# Patient Record
Sex: Male | Born: 1945 | Race: White | Hispanic: No | Marital: Married | Smoking: Former smoker
Health system: Southern US, Community
[De-identification: ages and names within clinical notes are randomized; demographics above are authoritative.]

## PROBLEM LIST (undated history)

## (undated) DIAGNOSIS — K644 Residual hemorrhoidal skin tags: Secondary | ICD-10-CM

## (undated) DIAGNOSIS — Z720 Tobacco use: Secondary | ICD-10-CM

## (undated) DIAGNOSIS — R011 Cardiac murmur, unspecified: Secondary | ICD-10-CM

## (undated) DIAGNOSIS — I10 Essential (primary) hypertension: Secondary | ICD-10-CM

## (undated) DIAGNOSIS — E785 Hyperlipidemia, unspecified: Secondary | ICD-10-CM

## (undated) HISTORY — DX: Cardiac murmur, unspecified: R01.1

## (undated) HISTORY — DX: Residual hemorrhoidal skin tags: K64.4

## (undated) HISTORY — PX: MOUTH SURGERY: SHX715

## (undated) HISTORY — DX: Hyperlipidemia, unspecified: E78.5

## (undated) HISTORY — DX: Tobacco use: Z72.0

## (undated) HISTORY — DX: Essential (primary) hypertension: I10

## (undated) HISTORY — PX: COLONOSCOPY: SHX174

---

## 2011-06-13 HISTORY — PX: COLONOSCOPY: SHX5424

## 2014-07-07 ENCOUNTER — Other Ambulatory Visit: Payer: Self-pay | Admitting: Internal Medicine

## 2014-07-07 DIAGNOSIS — F17200 Nicotine dependence, unspecified, uncomplicated: Secondary | ICD-10-CM

## 2014-08-14 ENCOUNTER — Ambulatory Visit: Payer: Self-pay

## 2015-07-22 ENCOUNTER — Encounter: Payer: Self-pay | Admitting: Physician Assistant

## 2015-07-29 ENCOUNTER — Ambulatory Visit (INDEPENDENT_AMBULATORY_CARE_PROVIDER_SITE_OTHER): Payer: Medicare Other | Admitting: Physician Assistant

## 2015-07-29 ENCOUNTER — Encounter: Payer: Self-pay | Admitting: Physician Assistant

## 2015-07-29 VITALS — BP 132/74 | HR 75 | Ht 72.0 in | Wt 193.0 lb

## 2015-07-29 DIAGNOSIS — I10 Essential (primary) hypertension: Secondary | ICD-10-CM

## 2015-07-29 DIAGNOSIS — E785 Hyperlipidemia, unspecified: Secondary | ICD-10-CM | POA: Insufficient documentation

## 2015-07-29 DIAGNOSIS — Z86010 Personal history of colonic polyps: Secondary | ICD-10-CM

## 2015-07-29 DIAGNOSIS — R195 Other fecal abnormalities: Secondary | ICD-10-CM | POA: Diagnosis not present

## 2015-07-29 NOTE — Patient Instructions (Signed)

## 2015-07-29 NOTE — Progress Notes (Addendum)
Patient ID: Gerald Turner, male   DOB: [birth year], 69 y.o.   MRN: [id number]   Subjective:    Patient ID: Gerald Turner, male    DOB: Feb 27, 1946, 69 y.o.   MRN: [id number]  HPI [name] is a pleasant 69 year old white male, new to GI today referred by Dr. Ballard Russell for evaluation of Hemoccult-positive stool. Patient has prior history of colon polyps and states that his last colonoscopy was done in 2012 in [place] he says he was told that he did have a couple of polyps and to follow-up in 5 years. Family history is negative for colon cancer. Patient did stool for occult blood testing as part of a routine physical and team assure has returned positive. He says he has not noticed any problems himself, specifically denies any abdominal pain,  changes in bowel habits, melena or hematochezia. He says he had been taking ibuprofen on a fairly regular basis for tooth pain earlier in the summer but stopped that as soon as she learned of the Hemoccult-positive stool. He is generally been in good health with history of hypertension and hyperlipidemia, he is a smoker.  Review of Systems Pertinent positive and negative review of systems were noted in the above HPI section.  All other review of systems was otherwise negative.  Outpatient Encounter Prescriptions as of 07/29/2015  Medication Sig  . aspirin 81 MG tablet Take 81 mg by mouth daily.  Marland Kitchen lisinopril (PRINIVIL,ZESTRIL) 10 MG tablet Take 10 mg by mouth daily.  . Multiple Vitamin (MULTIVITAMIN) tablet Take 1 tablet by mouth daily.  . simvastatin (ZOCOR) 40 MG tablet Take 40 mg by mouth daily.   No facility-administered encounter medications on file as of 07/29/2015.   Allergies  Allergen Reactions  . Penicillins Other (See Comments)   Patient Active Problem List   Diagnosis Date Noted  . HTN (hypertension) 07/29/2015  . Hyperlipidemia 07/29/2015   Social History   Social History  . Marital Status: Married    Spouse Name: Gerald Turner  . Number of  Children: Gerald Turner  . Years of Education: Gerald Turner   Occupational History  . Not on file.   Social History Main Topics  . Smoking status: Current Every Day Smoker -- 1.00 packs/day for 40 years    Types: Cigarettes  . Smokeless tobacco: Never Used  . Alcohol Use: 0.0 oz/week    0 Standard drinks or equivalent per week     Comment: 4 beers a day  . Drug Use: No  . Sexual Activity: Not on file   Other Topics Concern  . Not on file   Social History Narrative    Mr. Ratto's family history includes Breast cancer in his mother; Congestive Heart Failure in his mother; Diabetes in his father; Heart disease in his father; Hypertension in his father.      Objective:    Filed Vitals:   07/29/15 0900  BP: 132/74  Pulse: 75    Physical Exam well-developed older white male in no acute distress, pleasant blood pressure 132/74 pulse 75 height 6 foot weight 193. HEENT ;nontraumatic normocephalic EOMI PERRLA sclera anicteric, Supple ;no JVD, Cardiovascular; regular rate and rhythm with S1-S2 no murmur rub or gallop, Pulmonary; clear bilaterally, Abdomen ;soft nontender nondistended bowel sounds are active there is no palpable mass or hepatosplenomegaly, Rectal ;exam not done, Extremities; no clubbing cyanosis or edema skin warm and dry, Neuropsych; mood and affect appropriate     Assessment & Plan:   #1 69 yo male with heme + stool,  and hx of colon polyps- last colonoscopy 2012 in [place] with removal of polyps -type unclear- will  R/O occult lesion #2 HTN  #3 Smoker #4 hyperlipidemia  Plan; Pt will be scheduled   for Colonoscopy with Dr Christella Hartigan- procedure discussed in detail with pt and he is agreeable to proceed.  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    Hampton Wixom S  Elberta Lachapelle PA-C 07/29/2015   Cc: Alysia Penna, MD

## 2015-07-29 NOTE — Progress Notes (Signed)
I agree with the above note, plan. Seems like he is being 'over screened' with hemocult testing.  I will discuss colon cancer screening, polyp surveillance guidelines with him at time of his upcoming colonoscopy.

## 2015-08-19 ENCOUNTER — Telehealth: Payer: Self-pay | Admitting: *Deleted

## 2015-08-19 NOTE — Telephone Encounter (Signed)
Faxed a signed release on 07-29-2015.  Need this patient's Colonoscopy and pathology report from 06-09-2011.  I called that office today, 08-19-2015 and advised Aundra Millet in Medical Records I had not received the records yet.  She told me they faxed this information to me on 07-30-2015. I advised  I don't doubt she faxed it but asked her to refax it today and she said she could do that.  Received the colonoscopy and path report from 2012.  Stamped with Amy Esterwood PA's signature, date and MR # on each page, sent down to medical records department.

## 2015-09-14 ENCOUNTER — Encounter: Payer: Self-pay | Admitting: Gastroenterology

## 2015-10-26 ENCOUNTER — Ambulatory Visit (AMBULATORY_SURGERY_CENTER): Payer: Self-pay | Admitting: *Deleted

## 2015-10-26 VITALS — Ht 72.0 in | Wt 193.0 lb

## 2015-10-26 DIAGNOSIS — R195 Other fecal abnormalities: Secondary | ICD-10-CM

## 2015-10-26 MED ORDER — NA SULFATE-K SULFATE-MG SULF 17.5-3.13-1.6 GM/177ML PO SOLN: 1.0000 | Freq: Once | ORAL | Status: DC

## 2015-10-26 NOTE — Progress Notes (Signed)
No egg or soy allergy No issues with past sedation No diet pills No home 02 use+ emmi video  

## 2015-11-09 ENCOUNTER — Encounter: Payer: Self-pay | Admitting: Gastroenterology

## 2015-11-09 ENCOUNTER — Ambulatory Visit (AMBULATORY_SURGERY_CENTER): Payer: Medicare Other | Admitting: Gastroenterology

## 2015-11-09 VITALS — BP 114/71 | HR 66 | Temp 98.5°F | Resp 17 | Ht 72.0 in | Wt 193.0 lb

## 2015-11-09 DIAGNOSIS — R195 Other fecal abnormalities: Secondary | ICD-10-CM

## 2015-11-09 MED ORDER — SODIUM CHLORIDE 0.9 % IV SOLN: 500.0000 mL | INTRAVENOUS | Status: DC

## 2015-11-09 NOTE — Op Note (Signed)
[facility] [address] [place], [id number]   COLONOSCOPY PROCEDURE REPORT  PATIENT: Gerald Turner, Gerald Turner  MR#: [id number] BIRTHDATE: 1946-10-26 , 69  yrs. old GENDER: male ENDOSCOPIST: Rachael Feeaniel P Jacobs, MD REFERRED AO:ZHYQMBY:Scott Link SnufferHolwerda, M.D. PROCEDURE DATE:  11/09/2015 PROCEDURE:   Colonoscopy, diagnostic First Screening Colonoscopy - Avg.  risk and is 50 yrs.  old or older - No.  Prior Negative Screening - Now for repeat screening. [name]  History of Adenoma - Now for follow-up colonoscopy & has been > or = to 3 yrs.  Yes hx of adenoma.  Has been 3 or more years since last colonoscopy.  Recommend repeat exam, <10 yrs? No ASA CLASS:   Class II INDICATIONS:FOBT positive stool; colonoscopy 2012 found two sub CM adenomas. MEDICATIONS: Monitored anesthesia care and Propofol 220 mg IV  DESCRIPTION OF PROCEDURE:   After the risks benefits and alternatives of the procedure were thoroughly explained, informed consent was obtained.  The digital rectal exam revealed no abnormalities of the rectum.   The LB [id number]  endoscope was introduced through the anus and advanced to the cecum, which was identified by both the appendix and ileocecal valve. No adverse events experienced.   The quality of the prep was excellent.  The instrument was then slowly withdrawn as the colon was fully examined. Estimated blood loss is zero unless otherwise noted in this procedure report.   COLON FINDINGS: There was mild diverticulosis noted in the left colon.   The examination was otherwise normal.  Retroflexed views revealed no abnormalities. The time to cecum = 3.8 Withdrawal time = 7.5   The scope was withdrawn and the procedure completed. COMPLICATIONS: There were no immediate complications.  ENDOSCOPIC IMPRESSION: 1.   Mild diverticulosis was noted in the left colon 2.   The examination was otherwise normal  RECOMMENDATIONS: You should continue to follow colorectal cancer  screening guidelines for "routine risk" patients with a repeat colonoscopy in 10 years. There is no need for FOBT (stool) testing for colon cancer screening prior to then.  eSigned:  Rachael Feeaniel P Jacobs, MD 11/09/2015 1:58 PM

## 2015-11-09 NOTE — Patient Instructions (Signed)
YOU HAD AN ENDOSCOPIC PROCEDURE TODAY AT THE [facility]:   Refer to the procedure report that was given to you for any specific questions about what was found during the examination.  If the procedure report does not answer your questions, please call your gastroenterologist to clarify.  If you requested that your care partner not be given the details of your procedure findings, then the procedure report has been included in a sealed envelope for you to review at your convenience later.  YOU SHOULD EXPECT: Some feelings of bloating in the abdomen. Passage of more gas than usual.  Walking can help get rid of the air that was put into your GI tract during the procedure and reduce the bloating. If you had a lower endoscopy (such as a colonoscopy or flexible sigmoidoscopy) you may notice spotting of blood in your stool or on the toilet paper. If you underwent a bowel prep for your procedure, you may not have a normal bowel movement for a few days.  Please Note:  You might notice some irritation and congestion in your nose or some drainage.  This is from the oxygen used during your procedure.  There is no need for concern and it should clear up in a day or so.  SYMPTOMS TO REPORT IMMEDIATELY:   Following lower endoscopy (colonoscopy or flexible sigmoidoscopy):  Excessive amounts of blood in the stool  Significant tenderness or worsening of abdominal pains  Swelling of the abdomen that is new, acute  Fever of 100F or higher    For urgent or emergent issues, a gastroenterologist can be reached at any hour by calling [phone].   DIET: Your first meal following the procedure should be a small meal and then it is ok to progress to your normal diet. Heavy or fried foods are harder to digest and may make you feel nauseous or bloated.  Likewise, meals heavy in dairy and vegetables can increase bloating.  Drink plenty of fluids but you should avoid alcoholic beverages for 24  hours.  ACTIVITY:  You should plan to take it easy for the rest of today and you should NOT DRIVE or use heavy machinery until tomorrow (because of the sedation medicines used during the test).    FOLLOW UP: Our staff will call the number listed on your records the next business day following your procedure to check on you and address any questions or concerns that you may have regarding the information given to you following your procedure. If we do not reach you, we will leave a message.  However, if you are feeling well and you are not experiencing any problems, there is no need to return our call.  We will assume that you have returned to your regular daily activities without incident.  If any biopsies were taken you will be contacted by phone or by letter within the next 1-3 weeks.  Please call us at [phone] if you have not heard about the biopsies in 3 weeks.    SIGNATURES/CONFIDENTIALITY: You and/or your care partner have signed paperwork which will be entered into your electronic medical record.  These signatures attest to the fact that that the information above on your After Visit Summary has been reviewed and is understood.  Full responsibility of the confidentiality of this discharge information lies with you and/or your care-partner.   Resume medications. Information given on diverticulosis and high fiber diet. 

## 2015-11-09 NOTE — Progress Notes (Signed)
Report to PACU, RN, vss, BBS= Clear.  

## 2015-11-10 ENCOUNTER — Telehealth: Payer: Self-pay

## 2015-11-10 NOTE — Telephone Encounter (Signed)
  Follow up Call-  Call back number 11/09/2015  Post procedure Call Back phone  # [phone]  Permission to leave phone message Yes     Patient questions:  Do you have a fever, pain , or abdominal swelling? No. Pain Score  0 *  Have you tolerated food without any problems? Yes.    Have you been able to return to your normal activities? Yes.    Do you have any questions about your discharge instructions: Diet   No. Medications  No. Follow up visit  No.  Do you have questions or concerns about your Care? No.  Actions: * If pain score is 4 or above: No action needed, pain <4.  No problems per the pt. maw

## 2016-02-25 ENCOUNTER — Other Ambulatory Visit (INDEPENDENT_AMBULATORY_CARE_PROVIDER_SITE_OTHER): Payer: Self-pay | Admitting: Otolaryngology

## 2016-02-25 DIAGNOSIS — J329 Chronic sinusitis, unspecified: Secondary | ICD-10-CM

## 2016-03-04 ENCOUNTER — Ambulatory Visit
Admission: RE | Admit: 2016-03-04 | Discharge: 2016-03-04 | Disposition: A | Discharge status: Home / Self Care | Payer: Medicare Other | Source: Ambulatory Visit | Attending: Otolaryngology | Admitting: Otolaryngology

## 2016-03-04 DIAGNOSIS — J329 Chronic sinusitis, unspecified: Secondary | ICD-10-CM

## 2018-07-09 ENCOUNTER — Other Ambulatory Visit: Payer: Self-pay | Admitting: Internal Medicine

## 2018-07-27 ENCOUNTER — Other Ambulatory Visit: Payer: Self-pay | Admitting: Internal Medicine

## 2018-07-27 DIAGNOSIS — Z720 Tobacco use: Secondary | ICD-10-CM

## 2018-07-30 ENCOUNTER — Ambulatory Visit
Admission: RE | Admit: 2018-07-30 | Discharge: 2018-07-30 | Disposition: A | Discharge status: Home / Self Care | Payer: Medicare Other | Source: Ambulatory Visit | Attending: Internal Medicine | Admitting: Internal Medicine

## 2018-07-30 DIAGNOSIS — Z720 Tobacco use: Secondary | ICD-10-CM

## 2019-02-04 ENCOUNTER — Other Ambulatory Visit: Payer: Self-pay | Admitting: Internal Medicine

## 2019-02-04 DIAGNOSIS — Z0000 Encounter for general adult medical examination without abnormal findings: Secondary | ICD-10-CM

## 2019-02-04 DIAGNOSIS — R911 Solitary pulmonary nodule: Secondary | ICD-10-CM

## 2019-02-08 ENCOUNTER — Ambulatory Visit
Admission: RE | Admit: 2019-02-08 | Discharge: 2019-02-08 | Disposition: A | Discharge status: Home / Self Care | Payer: Medicare Other | Source: Ambulatory Visit | Attending: Internal Medicine | Admitting: Internal Medicine

## 2019-02-08 DIAGNOSIS — Z0000 Encounter for general adult medical examination without abnormal findings: Secondary | ICD-10-CM

## 2019-02-08 DIAGNOSIS — R911 Solitary pulmonary nodule: Secondary | ICD-10-CM

## 2020-01-16 ENCOUNTER — Ambulatory Visit: Payer: Medicare Other

## 2020-01-27 ENCOUNTER — Ambulatory Visit: Payer: Medicare Other

## 2020-08-04 ENCOUNTER — Other Ambulatory Visit: Payer: Self-pay | Admitting: Internal Medicine

## 2020-08-04 DIAGNOSIS — Z0000 Encounter for general adult medical examination without abnormal findings: Secondary | ICD-10-CM

## 2020-08-17 ENCOUNTER — Ambulatory Visit
Admission: RE | Admit: 2020-08-17 | Discharge: 2020-08-17 | Disposition: A | Discharge status: Home / Self Care | Payer: Medicare PPO | Source: Ambulatory Visit | Attending: Internal Medicine | Admitting: Internal Medicine

## 2020-08-17 DIAGNOSIS — Z0000 Encounter for general adult medical examination without abnormal findings: Secondary | ICD-10-CM

## 2021-10-05 DIAGNOSIS — Z125 Encounter for screening for malignant neoplasm of prostate: Secondary | ICD-10-CM | POA: Diagnosis not present

## 2021-10-05 DIAGNOSIS — E785 Hyperlipidemia, unspecified: Secondary | ICD-10-CM | POA: Diagnosis not present

## 2021-10-05 DIAGNOSIS — R7309 Other abnormal glucose: Secondary | ICD-10-CM | POA: Diagnosis not present

## 2021-10-05 DIAGNOSIS — I10 Essential (primary) hypertension: Secondary | ICD-10-CM | POA: Diagnosis not present

## 2021-10-12 DIAGNOSIS — R29898 Other symptoms and signs involving the musculoskeletal system: Secondary | ICD-10-CM | POA: Diagnosis not present

## 2021-10-12 DIAGNOSIS — Z1339 Encounter for screening examination for other mental health and behavioral disorders: Secondary | ICD-10-CM | POA: Diagnosis not present

## 2021-10-12 DIAGNOSIS — Z720 Tobacco use: Secondary | ICD-10-CM | POA: Diagnosis not present

## 2021-10-12 DIAGNOSIS — E785 Hyperlipidemia, unspecified: Secondary | ICD-10-CM | POA: Diagnosis not present

## 2021-10-12 DIAGNOSIS — I10 Essential (primary) hypertension: Secondary | ICD-10-CM | POA: Diagnosis not present

## 2021-10-12 DIAGNOSIS — Z0000 Encounter for general adult medical examination without abnormal findings: Secondary | ICD-10-CM | POA: Diagnosis not present

## 2021-10-12 DIAGNOSIS — Z1331 Encounter for screening for depression: Secondary | ICD-10-CM | POA: Diagnosis not present

## 2021-10-13 ENCOUNTER — Other Ambulatory Visit: Payer: Self-pay | Admitting: Internal Medicine

## 2021-10-13 DIAGNOSIS — Z0000 Encounter for general adult medical examination without abnormal findings: Secondary | ICD-10-CM

## 2021-11-01 ENCOUNTER — Ambulatory Visit
Admission: RE | Admit: 2021-11-01 | Discharge: 2021-11-01 | Disposition: A | Discharge status: Home / Self Care | Payer: Medicare PPO | Source: Ambulatory Visit | Attending: Internal Medicine | Admitting: Internal Medicine

## 2021-11-01 DIAGNOSIS — F17210 Nicotine dependence, cigarettes, uncomplicated: Secondary | ICD-10-CM | POA: Diagnosis not present

## 2021-11-01 DIAGNOSIS — Z0000 Encounter for general adult medical examination without abnormal findings: Secondary | ICD-10-CM

## 2022-02-16 DIAGNOSIS — H2513 Age-related nuclear cataract, bilateral: Secondary | ICD-10-CM | POA: Diagnosis not present

## 2022-02-16 DIAGNOSIS — H35363 Drusen (degenerative) of macula, bilateral: Secondary | ICD-10-CM | POA: Diagnosis not present

## 2022-02-16 DIAGNOSIS — H5203 Hypermetropia, bilateral: Secondary | ICD-10-CM | POA: Diagnosis not present

## 2022-02-16 DIAGNOSIS — H25013 Cortical age-related cataract, bilateral: Secondary | ICD-10-CM | POA: Diagnosis not present

## 2022-03-04 DIAGNOSIS — M25561 Pain in right knee: Secondary | ICD-10-CM | POA: Diagnosis not present

## 2022-03-04 DIAGNOSIS — M25562 Pain in left knee: Secondary | ICD-10-CM | POA: Diagnosis not present

## 2022-04-04 DIAGNOSIS — M25561 Pain in right knee: Secondary | ICD-10-CM | POA: Diagnosis not present

## 2022-04-04 DIAGNOSIS — M25562 Pain in left knee: Secondary | ICD-10-CM | POA: Diagnosis not present

## 2022-10-14 DIAGNOSIS — Z125 Encounter for screening for malignant neoplasm of prostate: Secondary | ICD-10-CM | POA: Diagnosis not present

## 2022-10-14 DIAGNOSIS — E785 Hyperlipidemia, unspecified: Secondary | ICD-10-CM | POA: Diagnosis not present

## 2022-10-14 DIAGNOSIS — I10 Essential (primary) hypertension: Secondary | ICD-10-CM | POA: Diagnosis not present

## 2022-10-14 DIAGNOSIS — R7989 Other specified abnormal findings of blood chemistry: Secondary | ICD-10-CM | POA: Diagnosis not present

## 2022-10-25 DIAGNOSIS — Z720 Tobacco use: Secondary | ICD-10-CM | POA: Diagnosis not present

## 2022-10-25 DIAGNOSIS — Z1339 Encounter for screening examination for other mental health and behavioral disorders: Secondary | ICD-10-CM | POA: Diagnosis not present

## 2022-10-25 DIAGNOSIS — Z23 Encounter for immunization: Secondary | ICD-10-CM | POA: Diagnosis not present

## 2022-10-25 DIAGNOSIS — Z1331 Encounter for screening for depression: Secondary | ICD-10-CM | POA: Diagnosis not present

## 2022-10-25 DIAGNOSIS — R82998 Other abnormal findings in urine: Secondary | ICD-10-CM | POA: Diagnosis not present

## 2022-10-25 DIAGNOSIS — R42 Dizziness and giddiness: Secondary | ICD-10-CM | POA: Diagnosis not present

## 2022-10-25 DIAGNOSIS — Z0000 Encounter for general adult medical examination without abnormal findings: Secondary | ICD-10-CM | POA: Diagnosis not present

## 2022-10-25 DIAGNOSIS — E785 Hyperlipidemia, unspecified: Secondary | ICD-10-CM | POA: Diagnosis not present

## 2022-10-25 DIAGNOSIS — I10 Essential (primary) hypertension: Secondary | ICD-10-CM | POA: Diagnosis not present

## 2022-10-26 ENCOUNTER — Other Ambulatory Visit: Payer: Self-pay | Admitting: Internal Medicine

## 2022-10-26 DIAGNOSIS — Z720 Tobacco use: Secondary | ICD-10-CM

## 2022-11-28 ENCOUNTER — Ambulatory Visit
Admission: RE | Admit: 2022-11-28 | Discharge: 2022-11-28 | Disposition: A | Discharge status: Home / Self Care | Payer: Medicare PPO | Source: Ambulatory Visit | Attending: Internal Medicine | Admitting: Internal Medicine

## 2022-11-28 DIAGNOSIS — I2510 Atherosclerotic heart disease of native coronary artery without angina pectoris: Secondary | ICD-10-CM | POA: Diagnosis not present

## 2022-11-28 DIAGNOSIS — K449 Diaphragmatic hernia without obstruction or gangrene: Secondary | ICD-10-CM | POA: Diagnosis not present

## 2022-11-28 DIAGNOSIS — F17210 Nicotine dependence, cigarettes, uncomplicated: Secondary | ICD-10-CM | POA: Diagnosis not present

## 2022-11-28 DIAGNOSIS — J432 Centrilobular emphysema: Secondary | ICD-10-CM | POA: Diagnosis not present

## 2022-11-28 DIAGNOSIS — Z720 Tobacco use: Secondary | ICD-10-CM

## 2023-02-06 DIAGNOSIS — H25013 Cortical age-related cataract, bilateral: Secondary | ICD-10-CM | POA: Diagnosis not present

## 2023-02-06 DIAGNOSIS — H5203 Hypermetropia, bilateral: Secondary | ICD-10-CM | POA: Diagnosis not present

## 2023-02-06 DIAGNOSIS — H2513 Age-related nuclear cataract, bilateral: Secondary | ICD-10-CM | POA: Diagnosis not present

## 2023-02-06 DIAGNOSIS — H524 Presbyopia: Secondary | ICD-10-CM | POA: Diagnosis not present

## 2023-02-06 DIAGNOSIS — H35363 Drusen (degenerative) of macula, bilateral: Secondary | ICD-10-CM | POA: Diagnosis not present

## 2023-03-09 IMAGING — CT CT CHEST LUNG CANCER SCREENING LOW DOSE W/O CM
2 of 5 series · 15 of 40 positions shown, 18 images · non-contrast
Comparison: 08/17/2020.

CLINICAL DATA: Current smoker, 49 pack-year history.

EXAM:
CT CHEST WITHOUT CONTRAST LOW-DOSE FOR LUNG CANCER SCREENING
TECHNIQUE: Multidetector CT imaging of the chest was performed following the
standard protocol without IV contrast.

[Series 4: lung 1.00 br44 cor · coronal · 0.72mm/px · 3 of 423 slices shown]
[im 85/423  lung]
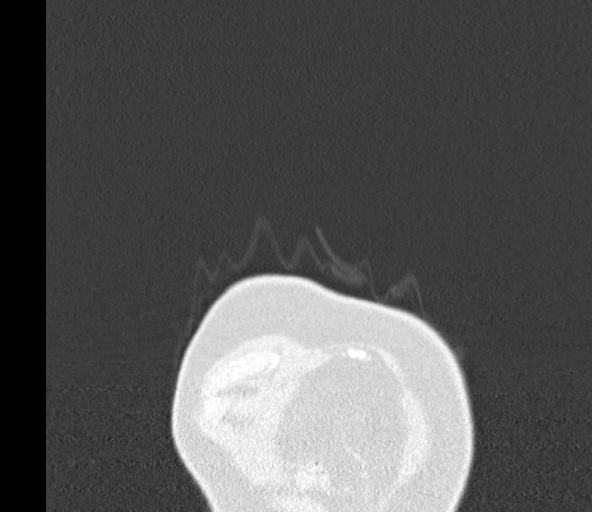
[im 169/423  lung]
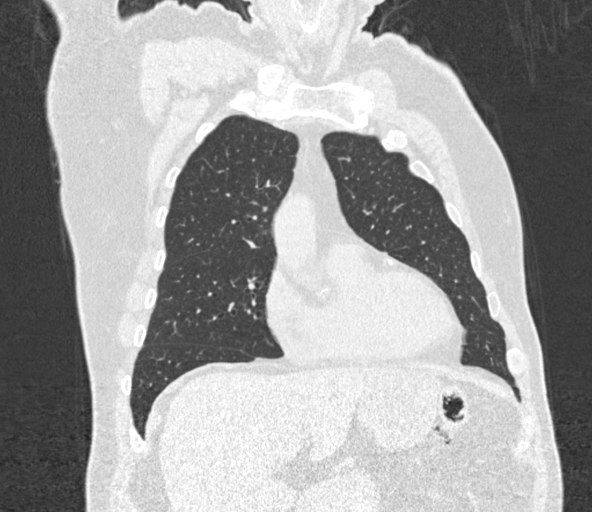
[im 254/423  lung]
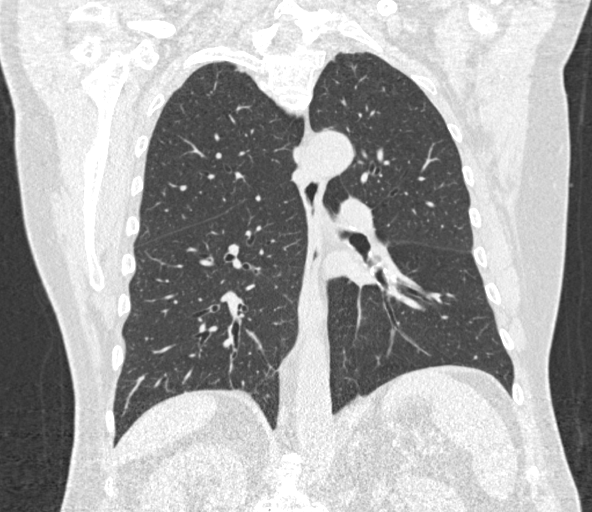

[Series 9: lung 1.00 br60 axial · axial · 0.83mm/px · z∈[-1103,-770]mm · 12 of 367 slices shown, 15 images]
[im 17/367  mediastinal]
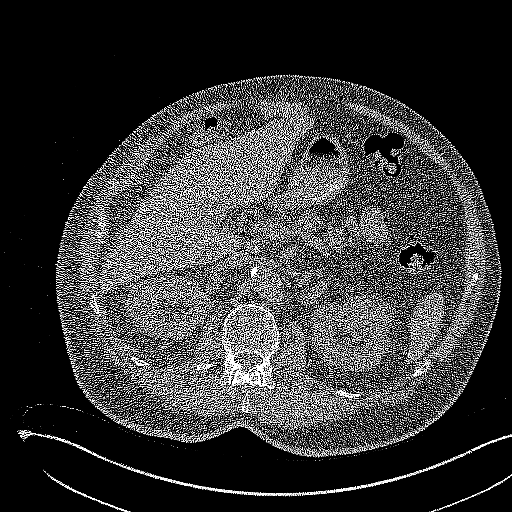
[im 17/367  lung]
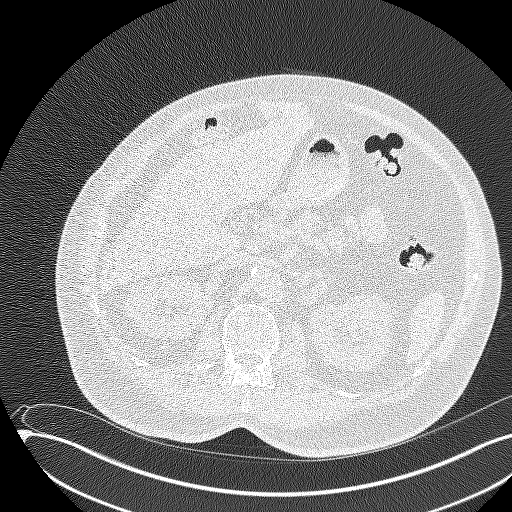
[im 50/367  lung]
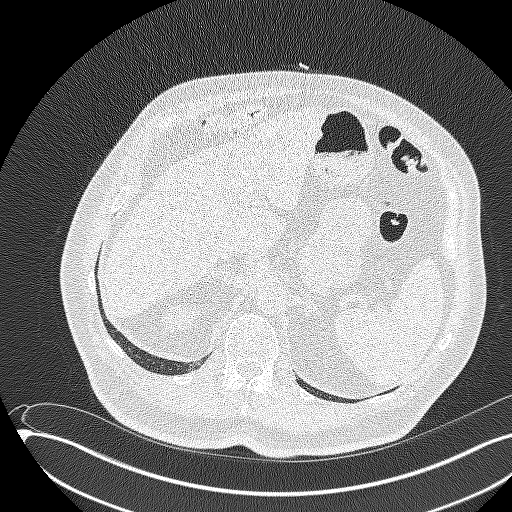
[im 84/367  lung]
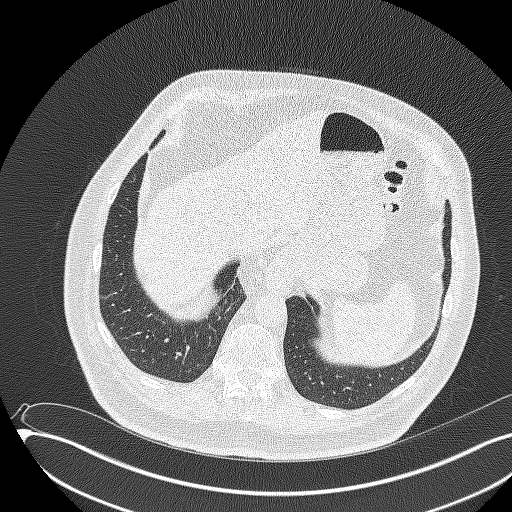
[im 117/367  lung]
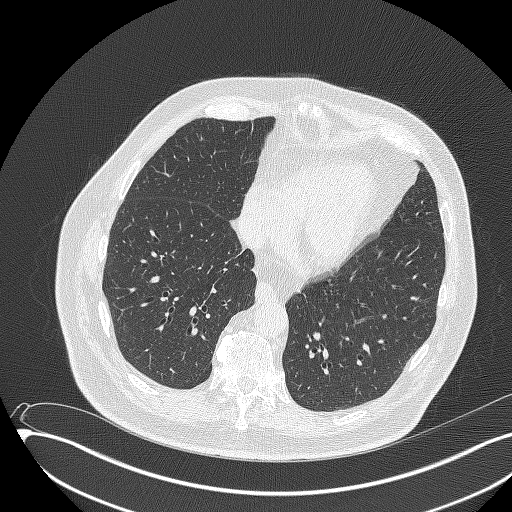
[im 134/367  mediastinal]
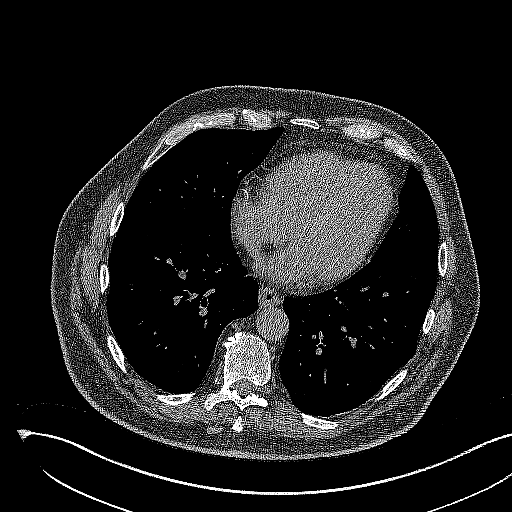
[im 134/367  lung]
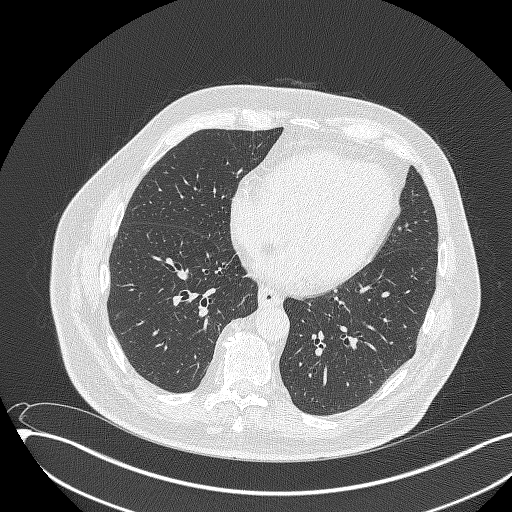
[im 167/367  lung]
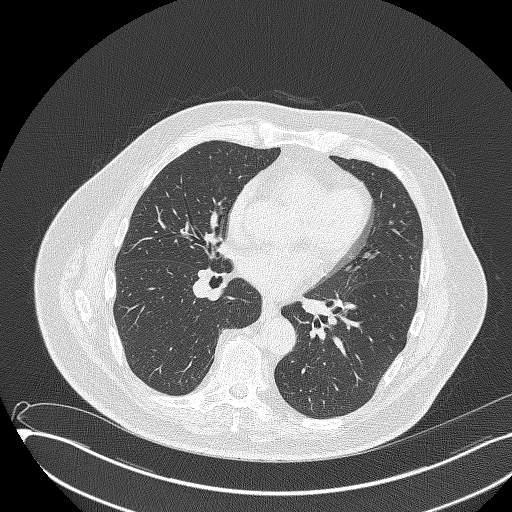
[im 200/367  lung]
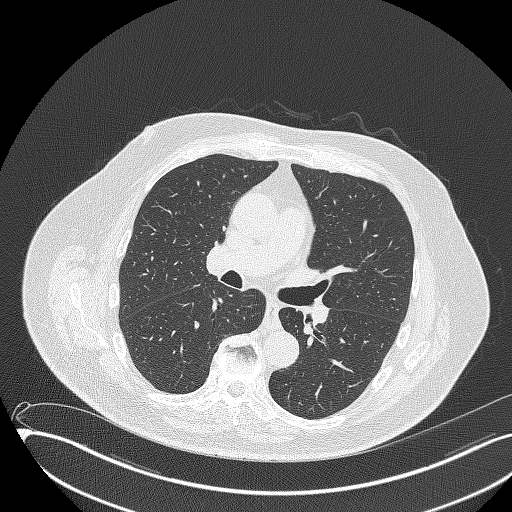
[im 233/367  lung]
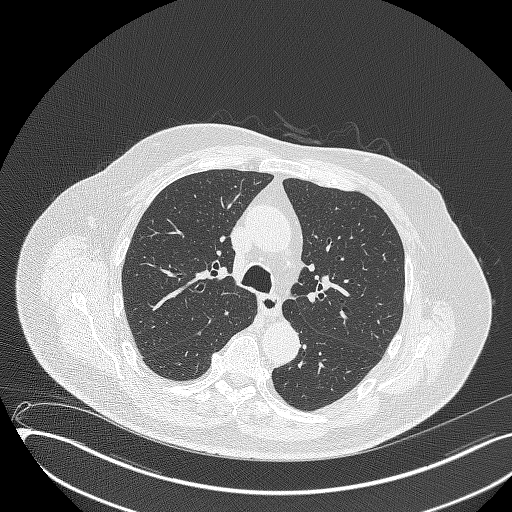
[im 250/367  mediastinal]
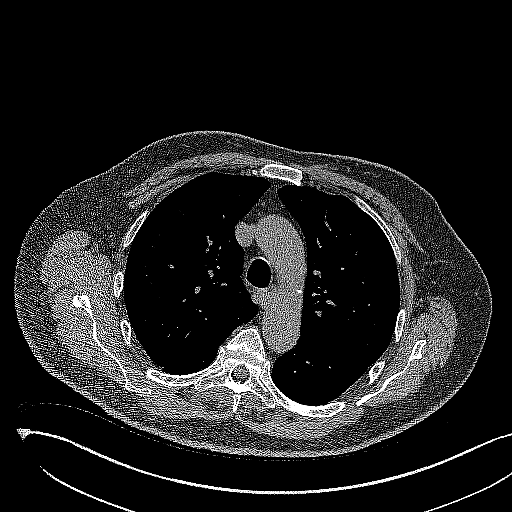
[im 250/367  lung]
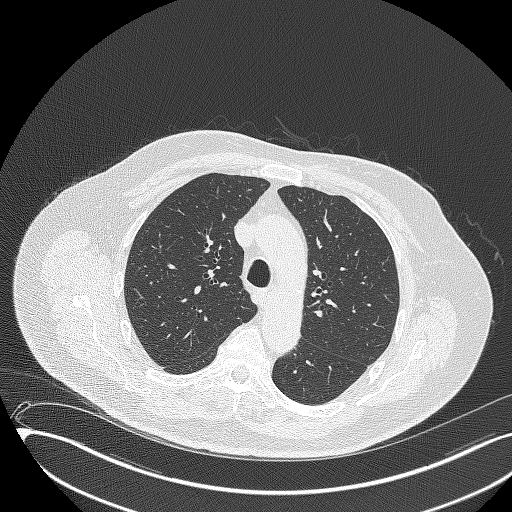
[im 283/367  lung]
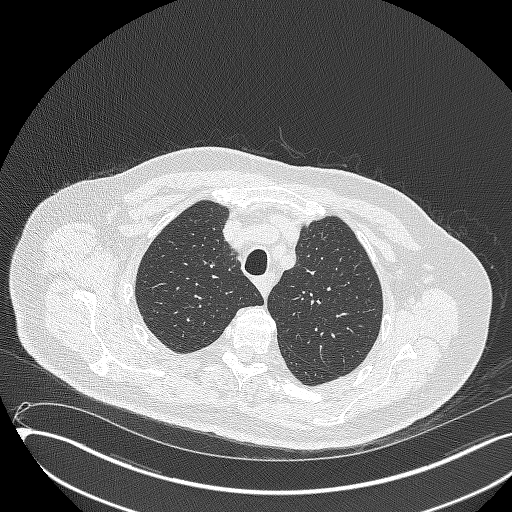
[im 317/367  lung]
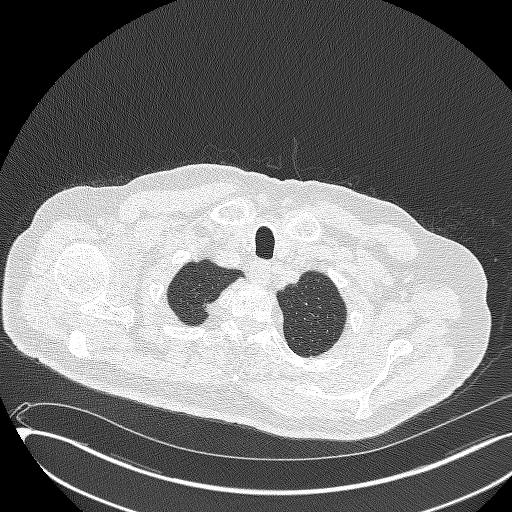
[im 350/367  lung]
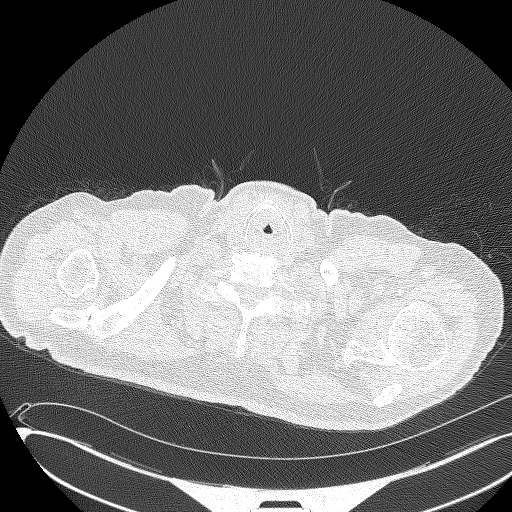

[15 of 40 positions shown; findings below may reference images not displayed]

FINDINGS: Cardiovascular: Atherosclerotic calcification of the aorta, aortic
valve and coronary arteries. Heart is at the upper limits of normal
in size. No pericardial effusion.

Mediastinum/Nodes: No pathologically enlarged mediastinal or
axillary lymph nodes. Hilar regions are difficult to evaluate
without IV contrast but appear grossly unremarkable. Esophagus is
grossly unremarkable. Small hiatal hernia.

Lungs/Pleura: Mild biapical pleuroparenchymal scarring. Calcified
granulomas. Pulmonary nodules measure 7.4 mm or less in size, as
before. Minimal centrilobular and paraseptal emphysema. No pleural
fluid. Minimal debris in the airway.

Upper Abdomen: Subcentimeter low-attenuation lesion in the dome of
the liver is too small to characterize but is unchanged and likely a
cyst. Visualized portions of the liver, adrenal glands, kidneys,
spleen, pancreas, stomach and bowel are otherwise unremarkable with
the exception of a small hiatal hernia. No upper abdominal
adenopathy.

Musculoskeletal: Degenerative changes in the spine. No worrisome
lytic or sclerotic lesions. Similar fluid associated with the right
shoulder musculature.
IMPRESSION: 1. Lung-RADS 2, benign appearance or behavior. Continue annual
screening with low-dose chest CT without contrast in 12 months.
2. Aortic atherosclerosis (LYTZ7-[id number].S). Coronary artery
calcification.
3.  Emphysema ([id number].8).

## 2023-10-24 DIAGNOSIS — Z125 Encounter for screening for malignant neoplasm of prostate: Secondary | ICD-10-CM | POA: Diagnosis not present

## 2023-10-24 DIAGNOSIS — Z1389 Encounter for screening for other disorder: Secondary | ICD-10-CM | POA: Diagnosis not present

## 2023-10-24 DIAGNOSIS — E785 Hyperlipidemia, unspecified: Secondary | ICD-10-CM | POA: Diagnosis not present

## 2023-10-24 DIAGNOSIS — I10 Essential (primary) hypertension: Secondary | ICD-10-CM | POA: Diagnosis not present

## 2023-10-31 DIAGNOSIS — R42 Dizziness and giddiness: Secondary | ICD-10-CM | POA: Diagnosis not present

## 2023-10-31 DIAGNOSIS — Z0000 Encounter for general adult medical examination without abnormal findings: Secondary | ICD-10-CM | POA: Diagnosis not present

## 2023-10-31 DIAGNOSIS — R2689 Other abnormalities of gait and mobility: Secondary | ICD-10-CM | POA: Diagnosis not present

## 2023-10-31 DIAGNOSIS — I10 Essential (primary) hypertension: Secondary | ICD-10-CM | POA: Diagnosis not present

## 2023-10-31 DIAGNOSIS — E785 Hyperlipidemia, unspecified: Secondary | ICD-10-CM | POA: Diagnosis not present

## 2023-10-31 DIAGNOSIS — R7301 Impaired fasting glucose: Secondary | ICD-10-CM | POA: Diagnosis not present

## 2023-10-31 DIAGNOSIS — R1319 Other dysphagia: Secondary | ICD-10-CM | POA: Diagnosis not present

## 2023-10-31 DIAGNOSIS — K219 Gastro-esophageal reflux disease without esophagitis: Secondary | ICD-10-CM | POA: Diagnosis not present

## 2023-10-31 DIAGNOSIS — Z1339 Encounter for screening examination for other mental health and behavioral disorders: Secondary | ICD-10-CM | POA: Diagnosis not present

## 2023-10-31 DIAGNOSIS — Z1331 Encounter for screening for depression: Secondary | ICD-10-CM | POA: Diagnosis not present

## 2023-10-31 DIAGNOSIS — R7303 Prediabetes: Secondary | ICD-10-CM | POA: Diagnosis not present

## 2023-12-15 DIAGNOSIS — M79605 Pain in left leg: Secondary | ICD-10-CM | POA: Diagnosis not present

## 2023-12-15 DIAGNOSIS — R2689 Other abnormalities of gait and mobility: Secondary | ICD-10-CM | POA: Diagnosis not present

## 2023-12-15 DIAGNOSIS — M79604 Pain in right leg: Secondary | ICD-10-CM | POA: Diagnosis not present

## 2023-12-15 DIAGNOSIS — R269 Unspecified abnormalities of gait and mobility: Secondary | ICD-10-CM | POA: Diagnosis not present

## 2024-02-16 DIAGNOSIS — H2513 Age-related nuclear cataract, bilateral: Secondary | ICD-10-CM | POA: Diagnosis not present

## 2024-02-16 DIAGNOSIS — H5203 Hypermetropia, bilateral: Secondary | ICD-10-CM | POA: Diagnosis not present

## 2024-02-16 DIAGNOSIS — H25013 Cortical age-related cataract, bilateral: Secondary | ICD-10-CM | POA: Diagnosis not present

## 2024-02-16 DIAGNOSIS — H353131 Nonexudative age-related macular degeneration, bilateral, early dry stage: Secondary | ICD-10-CM | POA: Diagnosis not present

## 2024-08-09 DIAGNOSIS — I10 Essential (primary) hypertension: Secondary | ICD-10-CM | POA: Diagnosis not present

## 2024-08-09 DIAGNOSIS — M25471 Effusion, right ankle: Secondary | ICD-10-CM | POA: Diagnosis not present

## 2024-08-09 DIAGNOSIS — R2689 Other abnormalities of gait and mobility: Secondary | ICD-10-CM | POA: Diagnosis not present

## 2024-11-04 DIAGNOSIS — M25471 Effusion, right ankle: Secondary | ICD-10-CM | POA: Diagnosis not present

## 2024-11-04 DIAGNOSIS — E1169 Type 2 diabetes mellitus with other specified complication: Secondary | ICD-10-CM | POA: Diagnosis not present

## 2024-11-04 DIAGNOSIS — E785 Hyperlipidemia, unspecified: Secondary | ICD-10-CM | POA: Diagnosis not present

## 2024-11-04 DIAGNOSIS — K219 Gastro-esophageal reflux disease without esophagitis: Secondary | ICD-10-CM | POA: Diagnosis not present

## 2024-11-04 DIAGNOSIS — R82998 Other abnormal findings in urine: Secondary | ICD-10-CM | POA: Diagnosis not present

## 2024-11-04 DIAGNOSIS — I10 Essential (primary) hypertension: Secondary | ICD-10-CM | POA: Diagnosis not present

## 2024-11-04 DIAGNOSIS — R1319 Other dysphagia: Secondary | ICD-10-CM | POA: Diagnosis not present

## 2024-11-04 DIAGNOSIS — F17200 Nicotine dependence, unspecified, uncomplicated: Secondary | ICD-10-CM | POA: Diagnosis not present

## 2024-11-04 DIAGNOSIS — R42 Dizziness and giddiness: Secondary | ICD-10-CM | POA: Diagnosis not present
